# Patient Record
Sex: Female | Born: 1935 | Race: White | Hispanic: No | Marital: Married
Health system: Southern US, Community
[De-identification: ages and names within clinical notes are randomized; demographics above are authoritative.]

---

## 2009-07-01 ENCOUNTER — Ambulatory Visit: Payer: Self-pay | Admitting: Internal Medicine

## 2009-07-09 ENCOUNTER — Ambulatory Visit: Payer: Self-pay | Admitting: Specialist

## 2009-07-11 ENCOUNTER — Ambulatory Visit: Payer: Self-pay | Admitting: Internal Medicine

## 2009-07-15 ENCOUNTER — Inpatient Hospital Stay: Payer: Self-pay | Admitting: Internal Medicine

## 2009-07-27 ENCOUNTER — Ambulatory Visit: Payer: Self-pay | Admitting: Specialist

## 2009-07-30 ENCOUNTER — Ambulatory Visit: Payer: Self-pay | Admitting: Internal Medicine

## 2009-08-11 ENCOUNTER — Ambulatory Visit: Payer: Self-pay | Admitting: Internal Medicine

## 2009-09-10 ENCOUNTER — Ambulatory Visit: Payer: Self-pay | Admitting: Internal Medicine

## 2009-09-14 ENCOUNTER — Ambulatory Visit: Payer: Self-pay | Admitting: Internal Medicine

## 2009-10-04 ENCOUNTER — Emergency Department: Payer: Self-pay | Admitting: Emergency Medicine

## 2009-10-11 ENCOUNTER — Ambulatory Visit: Payer: Self-pay | Admitting: Internal Medicine

## 2010-07-11 ENCOUNTER — Ambulatory Visit: Payer: Self-pay | Admitting: Specialist

## 2010-08-11 ENCOUNTER — Ambulatory Visit: Payer: Self-pay | Admitting: Internal Medicine

## 2010-09-01 ENCOUNTER — Ambulatory Visit: Payer: Self-pay | Admitting: Internal Medicine

## 2010-09-10 ENCOUNTER — Ambulatory Visit: Payer: Self-pay | Admitting: Internal Medicine

## 2010-10-11 ENCOUNTER — Ambulatory Visit: Payer: Self-pay | Admitting: Internal Medicine

## 2010-11-10 ENCOUNTER — Ambulatory Visit: Payer: Self-pay | Admitting: Internal Medicine

## 2010-12-11 ENCOUNTER — Ambulatory Visit: Payer: Self-pay | Admitting: Internal Medicine

## 2011-01-11 ENCOUNTER — Ambulatory Visit: Payer: Self-pay | Admitting: Internal Medicine

## 2011-02-09 ENCOUNTER — Ambulatory Visit: Payer: Self-pay | Admitting: Internal Medicine

## 2011-02-25 ENCOUNTER — Inpatient Hospital Stay: Payer: Self-pay | Admitting: Internal Medicine

## 2011-03-12 ENCOUNTER — Ambulatory Visit: Payer: Self-pay | Admitting: Internal Medicine

## 2011-03-23 ENCOUNTER — Ambulatory Visit: Payer: Self-pay | Admitting: Internal Medicine

## 2011-04-04 ENCOUNTER — Ambulatory Visit: Payer: Self-pay | Admitting: Internal Medicine

## 2011-04-11 ENCOUNTER — Ambulatory Visit: Payer: Self-pay | Admitting: Internal Medicine

## 2011-05-12 ENCOUNTER — Ambulatory Visit: Payer: Self-pay | Admitting: Internal Medicine

## 2011-06-11 ENCOUNTER — Ambulatory Visit: Payer: Self-pay | Admitting: Internal Medicine

## 2011-07-12 ENCOUNTER — Ambulatory Visit: Payer: Self-pay | Admitting: Internal Medicine

## 2011-08-12 ENCOUNTER — Ambulatory Visit: Payer: Self-pay | Admitting: Internal Medicine

## 2011-08-15 ENCOUNTER — Ambulatory Visit: Payer: Self-pay | Admitting: Internal Medicine

## 2011-09-11 ENCOUNTER — Ambulatory Visit: Payer: Self-pay | Admitting: Internal Medicine

## 2011-10-12 ENCOUNTER — Ambulatory Visit: Payer: Self-pay | Admitting: Internal Medicine

## 2011-11-11 ENCOUNTER — Ambulatory Visit: Payer: Self-pay | Admitting: Internal Medicine

## 2011-12-15 ENCOUNTER — Ambulatory Visit: Payer: Self-pay | Admitting: Internal Medicine

## 2011-12-15 LAB — CBC CANCER CENTER
Basophil %: 0.3 %
Eosinophil %: 2.7 %
HCT: 31.8 % — ABNORMAL LOW (ref 35.0–47.0)
HGB: 10.7 g/dL — ABNORMAL LOW (ref 12.0–16.0)
Lymphocyte %: 7 %
MCH: 32.7 pg (ref 26.0–34.0)
MCHC: 33.6 g/dL (ref 32.0–36.0)
Neutrophil #: 9.1 x10 3/mm — ABNORMAL HIGH (ref 1.4–6.5)
Neutrophil %: 86 %
RBC: 3.26 10*6/uL — ABNORMAL LOW (ref 3.80–5.20)

## 2011-12-15 LAB — MAGNESIUM: Magnesium: 1.8 mg/dL

## 2011-12-15 LAB — COMPREHENSIVE METABOLIC PANEL
Alkaline Phosphatase: 92 U/L (ref 50–136)
Anion Gap: 6 — ABNORMAL LOW (ref 7–16)
Bilirubin,Total: 0.2 mg/dL (ref 0.2–1.0)
Chloride: 104 mmol/L (ref 98–107)
Co2: 31 mmol/L (ref 21–32)
Creatinine: 1.58 mg/dL — ABNORMAL HIGH (ref 0.60–1.30)
EGFR (African American): 40 — ABNORMAL LOW
EGFR (Non-African Amer.): 33 — ABNORMAL LOW
SGOT(AST): 15 U/L (ref 15–37)
SGPT (ALT): 20 U/L

## 2012-01-01 ENCOUNTER — Ambulatory Visit: Payer: Self-pay | Admitting: Ophthalmology

## 2012-01-12 ENCOUNTER — Ambulatory Visit: Payer: Self-pay | Admitting: Internal Medicine

## 2012-01-16 ENCOUNTER — Ambulatory Visit: Payer: Self-pay | Admitting: Ophthalmology

## 2012-01-26 LAB — COMPREHENSIVE METABOLIC PANEL
Albumin: 3.6 g/dL (ref 3.4–5.0)
Anion Gap: 6 — ABNORMAL LOW (ref 7–16)
Bilirubin,Total: 0.3 mg/dL (ref 0.2–1.0)
Calcium, Total: 9.7 mg/dL (ref 8.5–10.1)
Chloride: 102 mmol/L (ref 98–107)
Creatinine: 1.77 mg/dL — ABNORMAL HIGH (ref 0.60–1.30)
EGFR (African American): 35 — ABNORMAL LOW
Glucose: 130 mg/dL — ABNORMAL HIGH (ref 65–99)
Potassium: 4.7 mmol/L (ref 3.5–5.1)
SGOT(AST): 20 U/L (ref 15–37)
SGPT (ALT): 20 U/L
Total Protein: 7 g/dL (ref 6.4–8.2)

## 2012-01-26 LAB — CBC CANCER CENTER
Basophil #: 0 x10 3/mm (ref 0.0–0.1)
Basophil %: 0.1 %
Eosinophil #: 0.1 x10 3/mm (ref 0.0–0.7)
Eosinophil %: 1.3 %
HGB: 11.4 g/dL — ABNORMAL LOW (ref 12.0–16.0)
Lymphocyte %: 9.9 %
MCHC: 33.3 g/dL (ref 32.0–36.0)
Neutrophil %: 83.7 %
Platelet: 137 x10 3/mm — ABNORMAL LOW (ref 150–440)
RBC: 3.49 10*6/uL — ABNORMAL LOW (ref 3.80–5.20)

## 2012-02-09 ENCOUNTER — Ambulatory Visit: Payer: Self-pay | Admitting: Internal Medicine

## 2012-02-27 ENCOUNTER — Ambulatory Visit: Payer: Self-pay | Admitting: Ophthalmology

## 2012-03-12 ENCOUNTER — Ambulatory Visit: Payer: Self-pay | Admitting: Internal Medicine

## 2012-03-12 LAB — CREATININE, SERUM
Creatinine: 1.62 mg/dL — ABNORMAL HIGH (ref 0.60–1.30)
EGFR (Non-African Amer.): 32 — ABNORMAL LOW

## 2012-04-01 LAB — CREATININE, SERUM
EGFR (African American): 43 — ABNORMAL LOW
EGFR (Non-African Amer.): 37 — ABNORMAL LOW

## 2012-04-10 ENCOUNTER — Ambulatory Visit: Payer: Self-pay | Admitting: Internal Medicine

## 2012-04-23 LAB — BASIC METABOLIC PANEL
Anion Gap: 7 (ref 7–16)
Calcium, Total: 9.3 mg/dL (ref 8.5–10.1)
Chloride: 100 mmol/L (ref 98–107)
Co2: 32 mmol/L (ref 21–32)
EGFR (Non-African Amer.): 28 — ABNORMAL LOW
Glucose: 115 mg/dL — ABNORMAL HIGH (ref 65–99)
Potassium: 4.3 mmol/L (ref 3.5–5.1)

## 2012-04-23 LAB — CBC CANCER CENTER
Basophil %: 0.8 %
Eosinophil %: 5.6 %
MCHC: 33 g/dL (ref 32.0–36.0)
MCV: 99 fL (ref 80–100)
Monocyte #: 0.5 x10 3/mm (ref 0.2–0.9)
Neutrophil #: 6 x10 3/mm (ref 1.4–6.5)
RBC: 2.89 10*6/uL — ABNORMAL LOW (ref 3.80–5.20)
RDW: 16 % — ABNORMAL HIGH (ref 11.5–14.5)

## 2012-04-23 LAB — HEPATIC FUNCTION PANEL A (ARMC)
Bilirubin, Direct: 0 mg/dL (ref 0.00–0.20)
Bilirubin,Total: 0.2 mg/dL (ref 0.2–1.0)
SGOT(AST): 25 U/L (ref 15–37)
Total Protein: 6.8 g/dL (ref 6.4–8.2)

## 2012-05-11 ENCOUNTER — Ambulatory Visit: Payer: Self-pay | Admitting: Internal Medicine

## 2012-06-04 LAB — CALCIUM: Calcium, Total: 9.6 mg/dL (ref 8.5–10.1)

## 2012-06-04 LAB — CREATININE, SERUM
Creatinine: 1.54 mg/dL — ABNORMAL HIGH (ref 0.60–1.30)
EGFR (African American): 36 — ABNORMAL LOW

## 2012-06-10 ENCOUNTER — Ambulatory Visit: Payer: Self-pay | Admitting: Internal Medicine

## 2012-07-16 ENCOUNTER — Ambulatory Visit: Payer: Self-pay | Admitting: Internal Medicine

## 2012-07-16 LAB — CBC CANCER CENTER
Basophil #: 0.1 x10 3/mm (ref 0.0–0.1)
Eosinophil #: 0.6 x10 3/mm (ref 0.0–0.7)
Eosinophil %: 6.1 %
HCT: 30.3 % — ABNORMAL LOW (ref 35.0–47.0)
Lymphocyte #: 1.2 x10 3/mm (ref 1.0–3.6)
MCHC: 33.8 g/dL (ref 32.0–36.0)
MCV: 97 fL (ref 80–100)
Monocyte #: 0.5 x10 3/mm (ref 0.2–0.9)
Neutrophil #: 7.1 x10 3/mm — ABNORMAL HIGH (ref 1.4–6.5)
Neutrophil %: 75.3 %
Platelet: 129 x10 3/mm — ABNORMAL LOW (ref 150–440)
RBC: 3.12 10*6/uL — ABNORMAL LOW (ref 3.80–5.20)
RDW: 17.1 % — ABNORMAL HIGH (ref 11.5–14.5)
WBC: 9.5 x10 3/mm (ref 3.6–11.0)

## 2012-07-16 LAB — BASIC METABOLIC PANEL
BUN: 32 mg/dL — ABNORMAL HIGH (ref 7–18)
Calcium, Total: 9.7 mg/dL (ref 8.5–10.1)
Co2: 29 mmol/L (ref 21–32)
EGFR (African American): 38 — ABNORMAL LOW
EGFR (Non-African Amer.): 32 — ABNORMAL LOW
Glucose: 127 mg/dL — ABNORMAL HIGH (ref 65–99)
Osmolality: 288 (ref 275–301)
Sodium: 140 mmol/L (ref 136–145)

## 2012-07-16 LAB — HEPATIC FUNCTION PANEL A (ARMC)
Alkaline Phosphatase: 106 U/L (ref 50–136)
Bilirubin, Direct: 0.1 mg/dL (ref 0.00–0.20)
Bilirubin,Total: 0.3 mg/dL (ref 0.2–1.0)
SGOT(AST): 22 U/L (ref 15–37)

## 2012-08-11 ENCOUNTER — Ambulatory Visit: Payer: Self-pay | Admitting: Internal Medicine

## 2012-08-27 LAB — CREATININE, SERUM: EGFR (Non-African Amer.): 34 — ABNORMAL LOW

## 2012-08-27 LAB — CALCIUM: Calcium, Total: 9 mg/dL (ref 8.5–10.1)

## 2012-09-10 ENCOUNTER — Ambulatory Visit: Payer: Self-pay | Admitting: Internal Medicine

## 2012-10-09 LAB — BASIC METABOLIC PANEL
Anion Gap: 10 (ref 7–16)
BUN: 32 mg/dL — ABNORMAL HIGH (ref 7–18)
Calcium, Total: 9.9 mg/dL (ref 8.5–10.1)
Chloride: 101 mmol/L (ref 98–107)
Co2: 30 mmol/L (ref 21–32)
Creatinine: 1.76 mg/dL — ABNORMAL HIGH (ref 0.60–1.30)
EGFR (Non-African Amer.): 26 — ABNORMAL LOW
Sodium: 141 mmol/L (ref 136–145)

## 2012-10-09 LAB — HEPATIC FUNCTION PANEL A (ARMC)
Albumin: 4.1 g/dL (ref 3.4–5.0)
Alkaline Phosphatase: 83 U/L (ref 50–136)
Bilirubin, Direct: <0.05 mg/dL (ref 0.00–0.20)
SGOT(AST): 26 U/L (ref 15–37)
SGPT (ALT): 24 U/L (ref 12–78)
Total Protein: 7.4 g/dL (ref 6.4–8.2)

## 2012-10-09 LAB — CBC CANCER CENTER
Eosinophil #: 0.7 x10 3/mm (ref 0.0–0.7)
HCT: 30.8 % — ABNORMAL LOW (ref 35.0–47.0)
Lymphocyte %: 14.2 %
MCH: 31.2 pg (ref 26.0–34.0)
MCHC: 31.8 g/dL — ABNORMAL LOW (ref 32.0–36.0)
Monocyte #: 0.5 x10 3/mm (ref 0.2–0.9)
Monocyte %: 4.4 %
Neutrophil #: 7.9 x10 3/mm — ABNORMAL HIGH (ref 1.4–6.5)
RDW: 17.3 % — ABNORMAL HIGH (ref 11.5–14.5)
WBC: 10.6 x10 3/mm (ref 3.6–11.0)

## 2012-10-11 ENCOUNTER — Ambulatory Visit: Payer: Self-pay

## 2012-10-11 ENCOUNTER — Ambulatory Visit: Payer: Self-pay | Admitting: Internal Medicine

## 2012-10-22 ENCOUNTER — Ambulatory Visit: Payer: Self-pay | Admitting: Internal Medicine

## 2012-11-10 ENCOUNTER — Ambulatory Visit: Payer: Self-pay | Admitting: Internal Medicine

## 2012-12-11 ENCOUNTER — Ambulatory Visit: Payer: Self-pay | Admitting: Internal Medicine

## 2013-01-08 LAB — CBC CANCER CENTER
Basophil #: 0.1 x10 3/mm (ref 0.0–0.1)
Eosinophil #: 0.4 x10 3/mm (ref 0.0–0.7)
HCT: 30.7 % — ABNORMAL LOW (ref 35.0–47.0)
HGB: 10.1 g/dL — ABNORMAL LOW (ref 12.0–16.0)
Lymphocyte %: 14.5 %
MCH: 32 pg (ref 26.0–34.0)
MCHC: 32.9 g/dL (ref 32.0–36.0)
MCV: 97 fL (ref 80–100)
Monocyte #: 0.3 x10 3/mm (ref 0.2–0.9)
RDW: 17.8 % — ABNORMAL HIGH (ref 11.5–14.5)

## 2013-01-08 LAB — HEPATIC FUNCTION PANEL A (ARMC)
Albumin: 3.7 g/dL (ref 3.4–5.0)
Bilirubin, Direct: <0.05 mg/dL (ref 0.00–0.20)
Bilirubin,Total: 0.3 mg/dL (ref 0.2–1.0)
SGOT(AST): 17 U/L (ref 15–37)
SGPT (ALT): 20 U/L (ref 12–78)
Total Protein: 7.2 g/dL (ref 6.4–8.2)

## 2013-01-08 LAB — BASIC METABOLIC PANEL
Chloride: 102 mmol/L (ref 98–107)
Co2: 31 mmol/L (ref 21–32)
EGFR (African American): 33 — ABNORMAL LOW
EGFR (Non-African Amer.): 28 — ABNORMAL LOW
Glucose: 148 mg/dL — ABNORMAL HIGH (ref 65–99)
Osmolality: 290 (ref 275–301)
Potassium: 4.9 mmol/L (ref 3.5–5.1)

## 2013-01-11 ENCOUNTER — Ambulatory Visit: Payer: Self-pay | Admitting: Internal Medicine

## 2013-02-18 ENCOUNTER — Ambulatory Visit: Payer: Self-pay | Admitting: Internal Medicine

## 2013-02-19 LAB — CALCIUM: Calcium, Total: 9.8 mg/dL (ref 8.5–10.1)

## 2013-02-19 LAB — CREATININE, SERUM
Creatinine: 1.61 mg/dL — ABNORMAL HIGH (ref 0.60–1.30)
EGFR (Non-African Amer.): 29 — ABNORMAL LOW

## 2013-03-06 LAB — CBC CANCER CENTER
Basophil %: 0.7 %
Eosinophil #: 0.4 x10 3/mm (ref 0.0–0.7)
HCT: 33.5 % — ABNORMAL LOW (ref 35.0–47.0)
HGB: 10.9 g/dL — ABNORMAL LOW (ref 12.0–16.0)
Lymphocyte #: 1 x10 3/mm (ref 1.0–3.6)
Lymphocyte %: 10.8 %
MCH: 31.4 pg (ref 26.0–34.0)
MCHC: 32.4 g/dL (ref 32.0–36.0)
Platelet: 164 x10 3/mm (ref 150–440)
RDW: 17.3 % — ABNORMAL HIGH (ref 11.5–14.5)
WBC: 8.9 x10 3/mm (ref 3.6–11.0)

## 2013-03-11 ENCOUNTER — Ambulatory Visit: Payer: Self-pay | Admitting: Internal Medicine

## 2013-03-13 LAB — CBC CANCER CENTER
Eosinophil #: 0.5 x10 3/mm (ref 0.0–0.7)
HCT: 31.8 % — ABNORMAL LOW (ref 35.0–47.0)
HGB: 10.2 g/dL — ABNORMAL LOW (ref 12.0–16.0)
Lymphocyte %: 8 %
MCH: 31 pg (ref 26.0–34.0)
Monocyte #: 0.4 x10 3/mm (ref 0.2–0.9)
Neutrophil #: 6.8 x10 3/mm — ABNORMAL HIGH (ref 1.4–6.5)
Neutrophil %: 81.2 %
RBC: 3.3 10*6/uL — ABNORMAL LOW (ref 3.80–5.20)
WBC: 8.3 x10 3/mm (ref 3.6–11.0)

## 2013-04-03 LAB — BASIC METABOLIC PANEL
Anion Gap: 4 — ABNORMAL LOW (ref 7–16)
BUN: 30 mg/dL — ABNORMAL HIGH (ref 7–18)
Calcium, Total: 10 mg/dL (ref 8.5–10.1)
Co2: 34 mmol/L — ABNORMAL HIGH (ref 21–32)
Creatinine: 1.9 mg/dL — ABNORMAL HIGH (ref 0.60–1.30)
Glucose: 204 mg/dL — ABNORMAL HIGH (ref 65–99)
Osmolality: 288 (ref 275–301)

## 2013-04-03 LAB — CBC CANCER CENTER
Basophil #: 0.1 x10 3/mm (ref 0.0–0.1)
HCT: 28.6 % — ABNORMAL LOW (ref 35.0–47.0)
HGB: 9.2 g/dL — ABNORMAL LOW (ref 12.0–16.0)
Lymphocyte #: 0.6 x10 3/mm — ABNORMAL LOW (ref 1.0–3.6)
Lymphocyte %: 6.5 %
MCHC: 32.2 g/dL (ref 32.0–36.0)
MCV: 97 fL (ref 80–100)
Monocyte #: 0.4 x10 3/mm (ref 0.2–0.9)
Monocyte %: 4.5 %
Platelet: 117 x10 3/mm — ABNORMAL LOW (ref 150–440)

## 2013-04-03 LAB — HEPATIC FUNCTION PANEL A (ARMC)
Albumin: 3.3 g/dL — ABNORMAL LOW (ref 3.4–5.0)
Bilirubin, Direct: <0.05 mg/dL (ref 0.00–0.20)
Bilirubin,Total: 0.4 mg/dL (ref 0.2–1.0)
SGOT(AST): 17 U/L (ref 15–37)
SGPT (ALT): 17 U/L (ref 12–78)
Total Protein: 6.6 g/dL (ref 6.4–8.2)

## 2013-04-10 ENCOUNTER — Ambulatory Visit: Payer: Self-pay | Admitting: Internal Medicine

## 2013-04-25 ENCOUNTER — Ambulatory Visit: Payer: Self-pay | Admitting: Internal Medicine

## 2013-05-11 ENCOUNTER — Ambulatory Visit: Payer: Self-pay | Admitting: Internal Medicine

## 2013-05-15 LAB — BASIC METABOLIC PANEL
Anion Gap: 7 (ref 7–16)
BUN: 38 mg/dL — ABNORMAL HIGH (ref 7–18)
Chloride: 100 mmol/L (ref 98–107)
EGFR (African American): 25 — ABNORMAL LOW
EGFR (Non-African Amer.): 22 — ABNORMAL LOW
Glucose: 172 mg/dL — ABNORMAL HIGH (ref 65–99)
Osmolality: 289 (ref 275–301)
Sodium: 138 mmol/L (ref 136–145)

## 2013-05-15 LAB — CBC CANCER CENTER
Basophil #: 0 x10 3/mm (ref 0.0–0.1)
Basophil %: 0.6 %
HCT: 29.2 % — ABNORMAL LOW (ref 35.0–47.0)
HGB: 9.7 g/dL — ABNORMAL LOW (ref 12.0–16.0)
Lymphocyte #: 0.7 x10 3/mm — ABNORMAL LOW (ref 1.0–3.6)
Lymphocyte %: 8.5 %
MCHC: 33 g/dL (ref 32.0–36.0)
MCV: 95 fL (ref 80–100)
Monocyte #: 0.4 x10 3/mm (ref 0.2–0.9)
Monocyte %: 4.8 %
Neutrophil %: 81 %
Platelet: 124 x10 3/mm — ABNORMAL LOW (ref 150–440)
RBC: 3.07 10*6/uL — ABNORMAL LOW (ref 3.80–5.20)
RDW: 17.4 % — ABNORMAL HIGH (ref 11.5–14.5)

## 2013-06-10 ENCOUNTER — Ambulatory Visit: Payer: Self-pay | Admitting: Internal Medicine

## 2013-06-26 LAB — CBC CANCER CENTER
Basophil #: 0 x10 3/mm (ref 0.0–0.1)
Basophil %: 0.3 %
Eosinophil %: 3.9 %
HCT: 28.1 % — ABNORMAL LOW (ref 35.0–47.0)
HGB: 9.5 g/dL — ABNORMAL LOW (ref 12.0–16.0)
Lymphocyte #: 0.9 x10 3/mm — ABNORMAL LOW (ref 1.0–3.6)
MCH: 31.3 pg (ref 26.0–34.0)
MCHC: 34 g/dL (ref 32.0–36.0)
Monocyte %: 3.4 %
Neutrophil %: 86 %
RBC: 3.05 10*6/uL — ABNORMAL LOW (ref 3.80–5.20)
RDW: 17.2 % — ABNORMAL HIGH (ref 11.5–14.5)

## 2013-06-26 LAB — BASIC METABOLIC PANEL
BUN: 32 mg/dL — ABNORMAL HIGH (ref 7–18)
Chloride: 93 mmol/L — ABNORMAL LOW (ref 98–107)
Co2: 32 mmol/L (ref 21–32)
EGFR (African American): 27 — ABNORMAL LOW
Glucose: 251 mg/dL — ABNORMAL HIGH (ref 65–99)
Osmolality: 278 (ref 275–301)
Potassium: 5.1 mmol/L (ref 3.5–5.1)

## 2013-07-11 ENCOUNTER — Ambulatory Visit: Payer: Self-pay | Admitting: Internal Medicine

## 2013-07-26 IMAGING — PT NM PET TUM IMG RESTAG (PS) SKULL BASE T - THIGH
5 series · 19 of 25 positions shown · non-contrast
Comparison: none

REASON FOR EXAM: NON Small Cell Cancer Restaging
COMMENTS:

PROCEDURE:     PET - PET/CT RESTAGING LUNG CA  - October 22, 2012  [DATE]
RESULT:     History: Lung cancer restage.
Comparison Study: Prior PET CT of 08/15/2011.

[Series 3: ct wb 3.0 b30f · axial · 3.0mm · 0.98mm/px · z∈[-380,+488]mm · 10 of 435 slices shown]
[im 1/435]
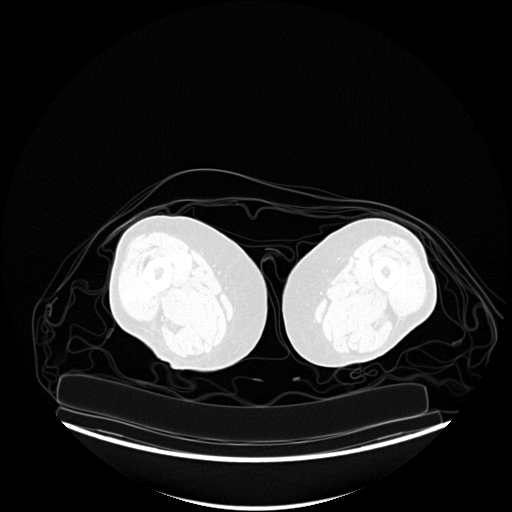
[im 37/435]
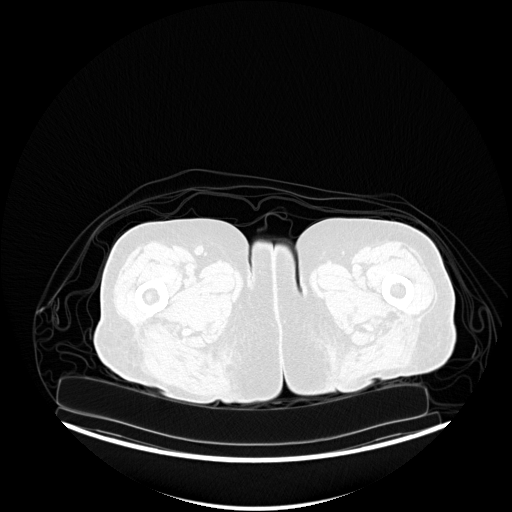
[im 109/435]
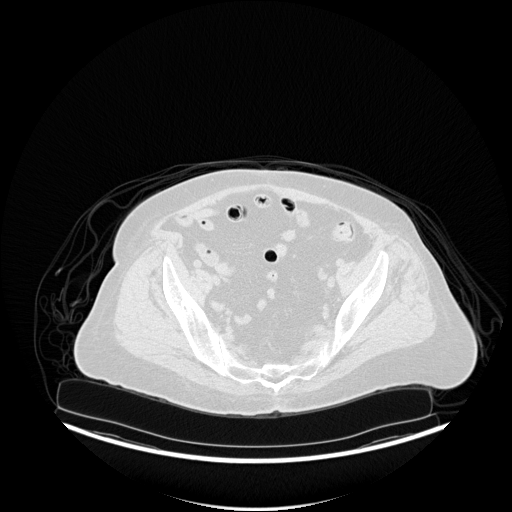
[im 145/435]
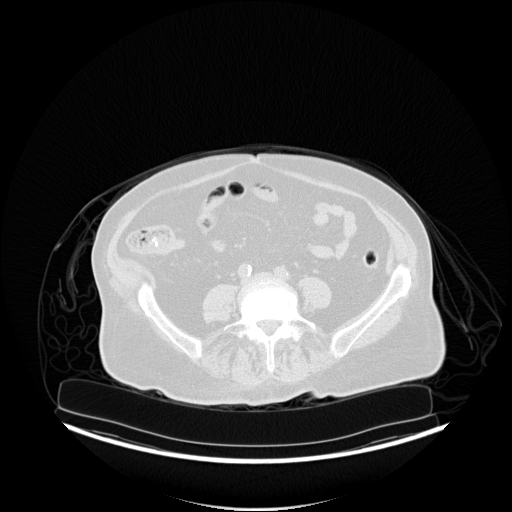
[im 181/435]
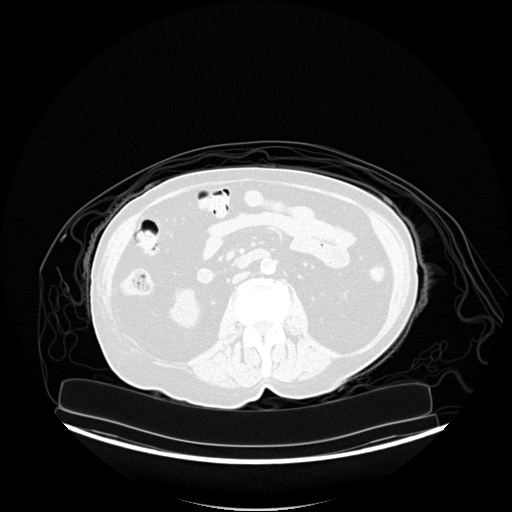
[im 254/435]
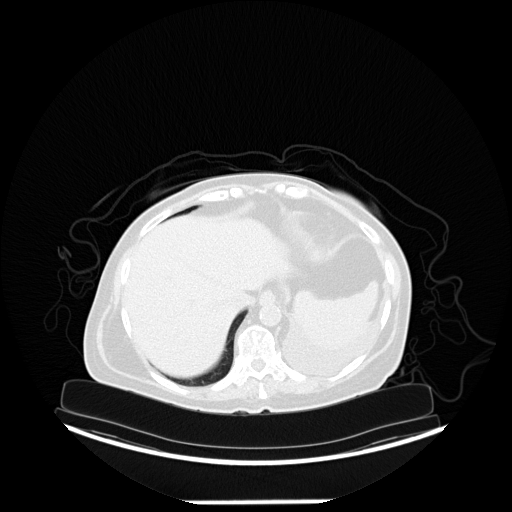
[im 290/435]
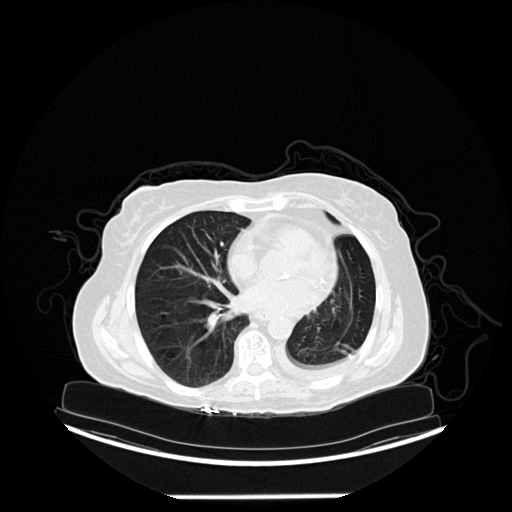
[im 326/435]
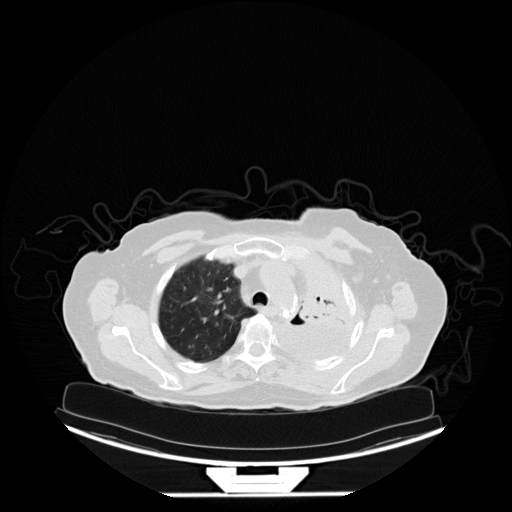
[im 398/435]
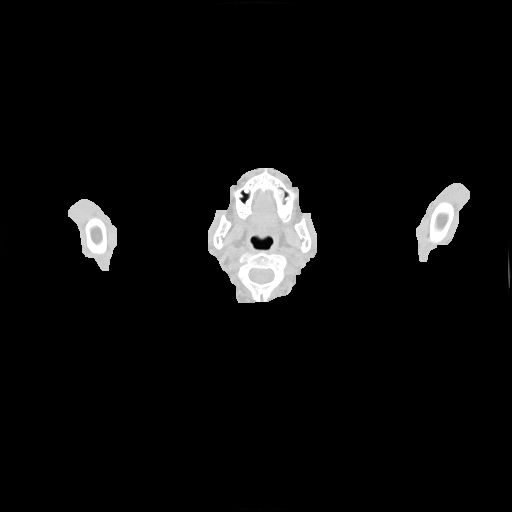
[im 435/435  brain]
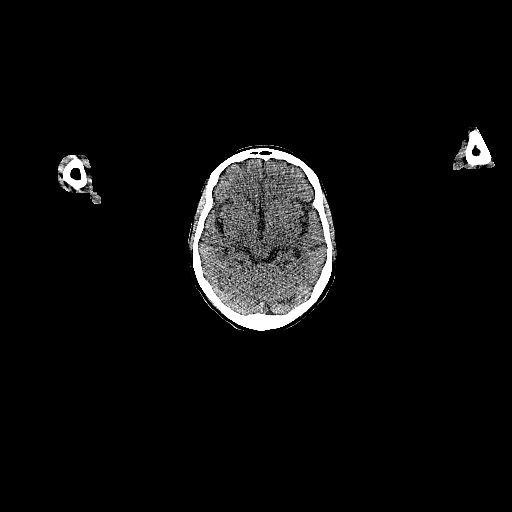

[Series 803: pet axial · 4 of 171 slices shown]
[im 1/171]
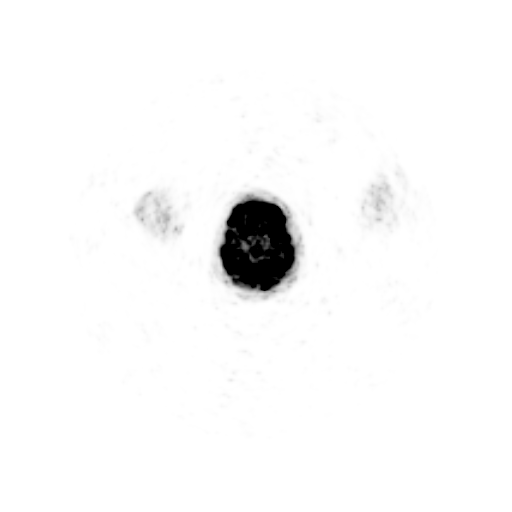
[im 69/171]
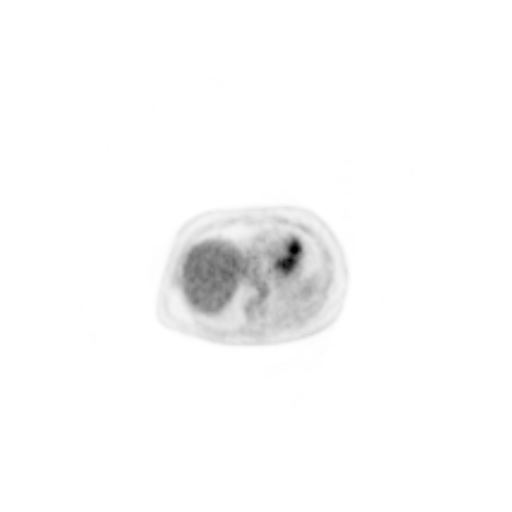
[im 103/171]
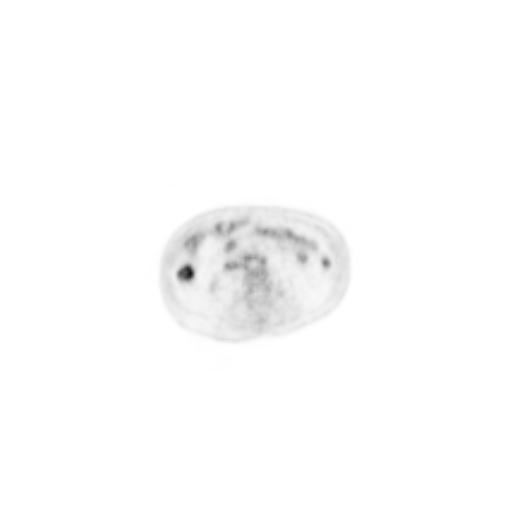
[im 137/171]
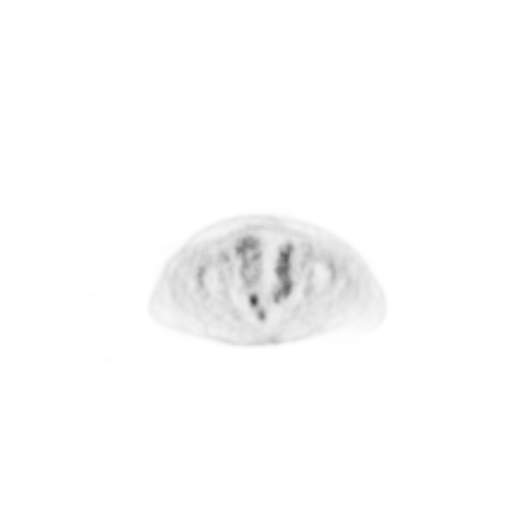

[Series 804: pet coronal · 2 of 63 slices shown]
[im 1/63]
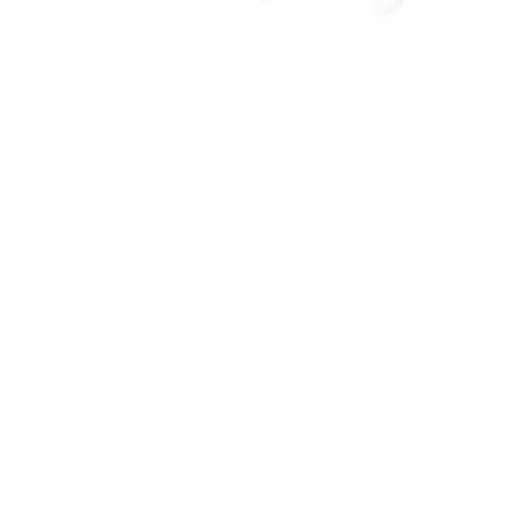
[im 63/63]
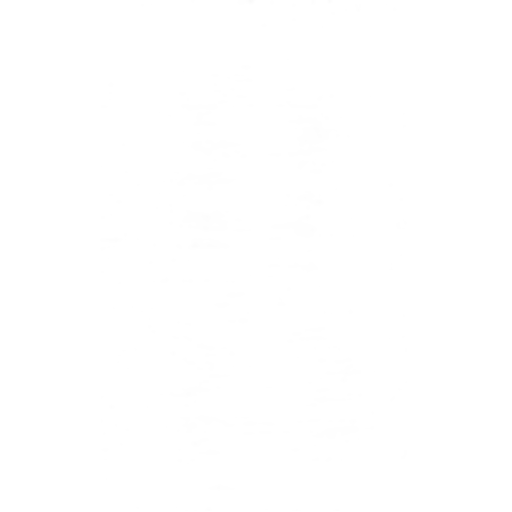

[Series 805: pet sagittal · 2 of 83 slices shown]
[im 1/83]
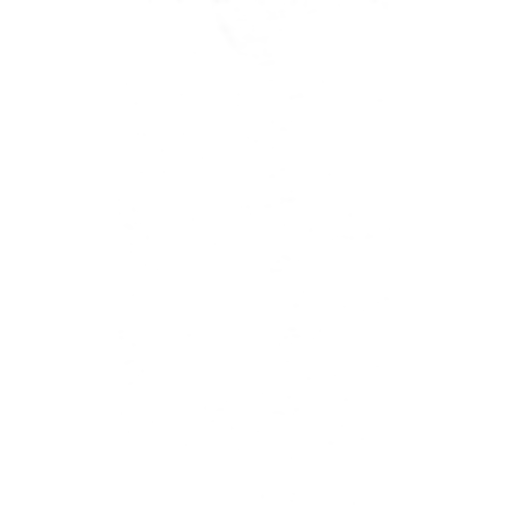
[im 83/83]
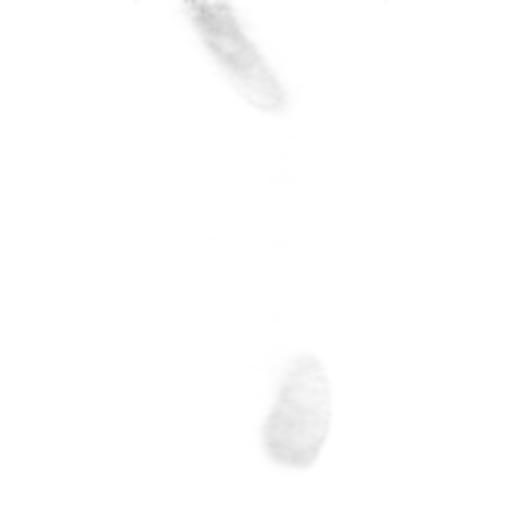

[Series 2037: mm oncology reading_mm oncology reading result ima · 1.31mm/px · 1 of 4 slices shown]
[im 1/4]
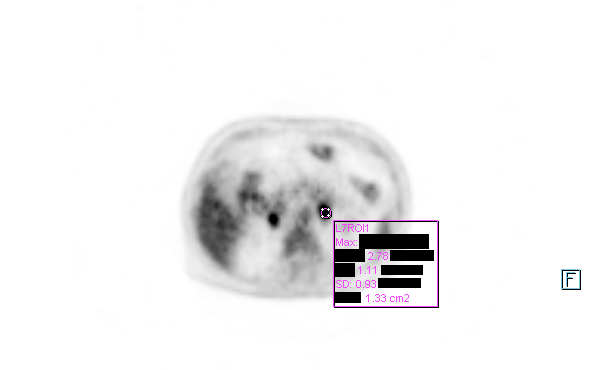

[19 of 25 positions shown; findings below may reference images not displayed]

FINDINGS: Standard PET CT following determination of fasting blood sugar of
87 mg/dL. 12.42 mCi of F-18 FDG administered to the patient. CT obtained for
attenuation correction and fusion. Previously identified left shoulder PET
positive lesion has partially
resolved. There has been partial resolution of left hilar/mediastinal PET
positive adenopathy. Right mid rib lesion has partially resolved.
 PET positive L5 vertebral body again present.

 PET positive adrenal lesions have progressed in size and FDG uptake. SUV
levels of the maximum of 4.2 noted. There is a new right posterior rib
lesion with SUV of maximum of 3.2. There is a new blastic PET positive focus
in the left posterior iliac wing with SUV value of 4 maximum. PET positive
blastic lesion is present in the proximal femur with a maximum SUV of 3.2.
IMPRESSION: 1. Interim partial resolution of left shoulder, hilar/mediastinal in the
right mid rib lesions.
2. Progression of adrenal lesions. Persistent L5 lesion.
3. New blastic PET positive foci left posterior iliac wing and in the left
proximal femur.

## 2013-08-07 LAB — CBC CANCER CENTER
Basophil #: 0.1 x10 3/mm (ref 0.0–0.1)
Basophil %: 0.6 %
Eosinophil #: 0.4 x10 3/mm (ref 0.0–0.7)
Eosinophil %: 3.2 %
HCT: 28.4 % — ABNORMAL LOW (ref 35.0–47.0)
HGB: 8.8 g/dL — ABNORMAL LOW (ref 12.0–16.0)
Lymphocyte #: 1.3 x10 3/mm (ref 1.0–3.6)
Lymphocyte %: 9.4 %
MCH: 28.3 pg (ref 26.0–34.0)
MCHC: 31.1 g/dL — ABNORMAL LOW (ref 32.0–36.0)
MCV: 91 fL (ref 80–100)
Monocyte #: 0.4 x10 3/mm (ref 0.2–0.9)
Monocyte %: 3.1 %
Neutrophil #: 11.7 x10 3/mm — ABNORMAL HIGH (ref 1.4–6.5)
Neutrophil %: 83.7 %
Platelet: 208 x10 3/mm (ref 150–440)
RBC: 3.12 10*6/uL — ABNORMAL LOW (ref 3.80–5.20)
RDW: 17.3 % — ABNORMAL HIGH (ref 11.5–14.5)
WBC: 14 x10 3/mm — ABNORMAL HIGH (ref 3.6–11.0)

## 2013-08-07 LAB — BASIC METABOLIC PANEL
Anion Gap: 7 (ref 7–16)
Calcium, Total: 9.1 mg/dL (ref 8.5–10.1)
Chloride: 95 mmol/L — ABNORMAL LOW (ref 98–107)
Co2: 30 mmol/L (ref 21–32)
EGFR (African American): 20 — ABNORMAL LOW
EGFR (Non-African Amer.): 18 — ABNORMAL LOW
Osmolality: 281 (ref 275–301)
Sodium: 132 mmol/L — ABNORMAL LOW (ref 136–145)

## 2013-08-11 ENCOUNTER — Ambulatory Visit: Payer: Self-pay | Admitting: Internal Medicine

## 2013-09-18 ENCOUNTER — Ambulatory Visit: Payer: Self-pay | Admitting: Oncology

## 2013-09-18 LAB — BASIC METABOLIC PANEL
Calcium, Total: 8.3 mg/dL — ABNORMAL LOW (ref 8.5–10.1)
Chloride: 96 mmol/L — ABNORMAL LOW (ref 98–107)
Co2: 30 mmol/L (ref 21–32)
Creatinine: 1.76 mg/dL — ABNORMAL HIGH (ref 0.60–1.30)
Glucose: 181 mg/dL — ABNORMAL HIGH (ref 65–99)
Osmolality: 277 (ref 275–301)
Potassium: 6.6 mmol/L (ref 3.5–5.1)
Sodium: 131 mmol/L — ABNORMAL LOW (ref 136–145)

## 2013-09-18 LAB — CBC CANCER CENTER
Basophil #: 0.1 x10 3/mm (ref 0.0–0.1)
Basophil %: 0.5 %
Eosinophil %: 4.5 %
HCT: 25 % — ABNORMAL LOW (ref 35.0–47.0)
HGB: 8.1 g/dL — ABNORMAL LOW (ref 12.0–16.0)
Lymphocyte #: 0.9 x10 3/mm — ABNORMAL LOW (ref 1.0–3.6)
MCH: 29.3 pg (ref 26.0–34.0)
Monocyte %: 3.7 %
Neutrophil #: 11.2 x10 3/mm — ABNORMAL HIGH (ref 1.4–6.5)
Neutrophil %: 84.7 %
Platelet: 190 x10 3/mm (ref 150–440)
RDW: 17.3 % — ABNORMAL HIGH (ref 11.5–14.5)
WBC: 13.3 x10 3/mm — ABNORMAL HIGH (ref 3.6–11.0)

## 2013-10-11 ENCOUNTER — Ambulatory Visit: Payer: Self-pay | Admitting: Oncology

## 2013-10-30 LAB — BASIC METABOLIC PANEL
Anion Gap: 6 — ABNORMAL LOW (ref 7–16)
Co2: 28 mmol/L (ref 21–32)
EGFR (Non-African Amer.): 26 — ABNORMAL LOW
Osmolality: 278 (ref 275–301)
Potassium: 6.7 mmol/L (ref 3.5–5.1)
Sodium: 132 mmol/L — ABNORMAL LOW (ref 136–145)

## 2013-11-10 ENCOUNTER — Ambulatory Visit: Payer: Self-pay | Admitting: Oncology

## 2013-11-10 DEATH — deceased

## 2015-04-04 NOTE — Op Note (Signed)
PATIENT NAME:  Amy Strickland, Amy Strickland MR#:  [id number] DATE OF BIRTH:  1926/09/30  DATE OF PROCEDURE:  01/16/2012  PREOPERATIVE DIAGNOSIS: Visually significant cataract of the right eye.   POSTOPERATIVE DIAGNOSIS: Visually significant cataract of the right eye.   OPERATIVE PROCEDURE: Cataract extraction by phacoemulsification with implant of intraocular lens to right eye.   SURGEON: Galen ManilaWilliam Shahidah Nesbitt, MD.   ANESTHESIA:  1. Managed anesthesia care.  2. Topical tetracaine drops followed by 2% Xylocaine jelly applied in the preoperative holding area.   COMPLICATIONS: None.   TECHNIQUE:  Stop and chop.   DESCRIPTION OF PROCEDURE: The patient was examined and consented in the preoperative holding area where the aforementioned topical anesthesia was applied to the right eye and then brought back to the Operating Room where the right eye was prepped and draped in the usual sterile ophthalmic fashion and a lid speculum was placed. A paracentesis was created with the side port blade and the anterior chamber was filled with viscoelastic. A near clear corneal incision was performed with the steel keratome. A continuous curvilinear capsulorrhexis was performed with a cystotome followed by the capsulorrhexis forceps. Hydrodissection and hydrodelineation were carried out with BSS on a blunt cannula. The lens was removed in a stop and chop technique and the remaining cortical material was removed with the irrigation-aspiration handpiece. The capsular bag was inflated with viscoelastic and the Technis ZCB00 21.0-diopter lens, serial number [id number] was placed in the capsular bag without complication. The remaining viscoelastic was removed from the eye with the irrigation-aspiration handpiece. The wounds were hydrated. The anterior chamber was flushed with Miostat and the eye was inflated to physiologic pressure. The wounds were found to be water tight. The eye was dressed with Vigamox and Omnipred. The patient was  given protective glasses to wear throughout the day and a shield with which to sleep tonight. The patient was also given drops with which to begin a drop regimen today and will follow-up with me in one day.   ____________________________ Jerilee FieldWilliam L. Alida Greiner, MD wlp:cms D: 01/16/2012 13:04:55 ET T: 01/16/2012 13:23:21 ET JOB#: [id number]  cc: Ngoc Detjen L. Darrin Koman, MD, <Dictator> Jerilee FieldWILLIAM L Kue Fox MD ELECTRONICALLY SIGNED 01/23/2012 12:47

## 2015-04-04 NOTE — Op Note (Signed)
PATIENT NAME:  Harless LittenDONALDSON, Raylynn W MR#:  [id number] DATE OF BIRTH:  [birth year]  DATE OF PROCEDURE:  02/27/2012  PREOPERATIVE DIAGNOSIS: Visually significant cataract of the left eye.   POSTOPERATIVE DIAGNOSIS: Visually significant cataract of the left eye.   OPERATIVE PROCEDURE: Cataract extraction by phacoemulsification with implant of intraocular lens to left eye.   SURGEON: Galen ManilaWilliam Camrin Lapre, MD.   ANESTHESIA:  1. Managed anesthesia care.  2. Topical tetracaine drops followed by 2% Xylocaine jelly applied in the preoperative holding area.   COMPLICATIONS: None.   TECHNIQUE:  Stop and chop.    DESCRIPTION OF PROCEDURE: The patient was examined and consented in the preoperative holding area where the aforementioned topical anesthesia was applied to the left eye and then brought back to the Operating Room where the left eye was prepped and draped in the usual sterile ophthalmic fashion and a lid speculum was placed. A paracentesis was created with the side port blade and the anterior chamber was filled with viscoelastic. A near clear corneal incision was performed with the steel keratome. A continuous curvilinear capsulorrhexis was performed with a cystotome followed by the capsulorrhexis forceps. Hydrodissection and hydrodelineation were carried out with BSS on a blunt cannula. The lens was removed in a stop and chop technique and the remaining cortical material was removed with the irrigation-aspiration handpiece. The capsular bag was inflated with viscoelastic and the Technis ZCB00 21.0-diopter lens, serial number [id number] was placed in the capsular bag without complication. The remaining viscoelastic was removed from the eye with the irrigation-aspiration handpiece. The wounds were hydrated. The anterior chamber was flushed with Miostat and the eye was inflated to physiologic pressure. The wounds were found to be water tight. The eye was dressed with Vigamox. The patient was given protective  glasses to wear throughout the day and a shield with which to sleep tonight. The patient was also given drops with which to begin a drop regimen today and will follow-up with me in one day.   ____________________________ Jerilee FieldWilliam L. Draycen Leichter, MD wlp:cms D: 02/27/2012 11:56:32 ET T: 02/27/2012 12:02:24 ET JOB#: [id number]  cc: Zanaiya Calabria L. Daison Braxton, MD, <Dictator> Jerilee FieldWILLIAM L Saprina Chuong MD ELECTRONICALLY SIGNED 02/29/2012 10:36
